# Patient Record
Sex: Female | Born: 1980 | Marital: Single | Smoking: Former smoker
Health system: Southern US, Community
[De-identification: ages and names within clinical notes are randomized; demographics above are authoritative.]

---

## 2016-07-28 ENCOUNTER — Encounter: Payer: Self-pay | Admitting: Obstetrics and Gynecology

## 2016-09-09 ENCOUNTER — Encounter: Payer: Self-pay | Admitting: Emergency Medicine

## 2016-09-09 ENCOUNTER — Emergency Department: Payer: Commercial Managed Care - PPO

## 2016-09-09 ENCOUNTER — Emergency Department
Admission: EM | Admit: 2016-09-09 | Discharge: 2016-09-09 | Disposition: A | Discharge status: Home / Self Care | Payer: Commercial Managed Care - PPO | Attending: Student in an Organized Health Care Education/Training Program | Admitting: Student in an Organized Health Care Education/Training Program

## 2016-09-09 DIAGNOSIS — M79662 Pain in left lower leg: Secondary | ICD-10-CM | POA: Diagnosis not present

## 2016-09-09 DIAGNOSIS — M79661 Pain in right lower leg: Secondary | ICD-10-CM | POA: Diagnosis present

## 2016-09-09 DIAGNOSIS — Z87891 Personal history of nicotine dependence: Secondary | ICD-10-CM | POA: Diagnosis not present

## 2016-09-09 MED ORDER — CYCLOBENZAPRINE HCL 10 MG PO TABS: 10.0000 mg | ORAL_TABLET | Freq: Three times a day (TID) | ORAL | 0 refills | Status: AC | PRN

## 2016-09-09 NOTE — ED Provider Notes (Signed)
[facility] Emergency Department Provider Note   ____________________________________________   First MD Initiated Contact with Patient 09/09/16 1519     (approximate)  I have reviewed the triage vital signs and the nursing notes.   HISTORY  Chief Complaint Leg Pain    HPI Sylvia Donaldson is a 36 y.o. female patient complain of bilateralcalf pain. Patient stated onset of pain after 3 weeks receiving iron effusions. Patient described a pain as "achy". Patient denies chest pain or shortness of breath. Patient state able to ambulate without difficulty. Patient rates the pain as a 6/10. No palliative measures for complaint. Patient discussed findings fusion tomorrow morning.  History reviewed. No pertinent past medical history.  There are no active problems to display for this patient.   History reviewed. No pertinent surgical history.  Prior to Admission medications   Medication Sig Start Date End Date Taking? Authorizing Provider  cyclobenzaprine (FLEXERIL) 10 MG tablet Take 1 tablet (10 mg total) by mouth 3 (three) times daily as needed. 09/09/16   Joni ReiningSmith, Nabiha Planck K, PA-C  cyclobenzaprine (FLEXERIL) 10 MG tablet Take 1 tablet (10 mg total) by mouth 3 (three) times daily as needed. 09/09/16   Joni ReiningSmith, Montez Stryker K, PA-C    Allergies Patient has no known allergies.  History reviewed. No pertinent family history.  Social History Social History  Substance Use Topics  . Smoking status: Former Games developermoker  . Smokeless tobacco: Never Used  . Alcohol use No    Review of Systems  Constitutional: No fever/chills Eyes: No visual changes. ENT: No sore throat. Cardiovascular: Denies chest pain. Respiratory: Denies shortness of breath. Gastrointestinal: No abdominal pain.  No nausea, no vomiting.  No diarrhea.  No constipation. Genitourinary: Negative for dysuria. Musculoskeletal: Bilateral leg pain. Skin: Negative for rash. Neurological: Negative for headaches,  focal weakness or numbness. 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 --      Excl. in GC? --     Constitutional: Alert and oriented. Well appearing and in no acute distress. Nose: No congestion/rhinnorhea. Mouth/Throat: Mucous membranes are moist.  Oropharynx non-erythematous. Neck: No stridor.  No cervical spine tenderness to palpation. Hematological/Lymphatic/Immunilogical: No cervical lymphadenopathy. Cardiovascular: Normal rate, regular rhythm. Grossly normal heart sounds.  Good peripheral circulation. Respiratory: Normal respiratory effort.  No retractions. Lungs CTAB. Musculoskeletal: no obvious edema or erythema bilateral calf. Negative Homans sign.  Neurologic:  Normal speech and language. No gross focal neurologic deficits are appreciated. No gait instability. Skin:  Skin is warm, dry and intact. No rash noted. Psychiatric: Mood and affect are normal. Speech and behavior are normal.  ____________________________________________   LABS (all labs ordered are listed, but only abnormal results are displayed)  Labs Reviewed - No data to display ____________________________________________  EKG   ____________________________________________  RADIOLOGY  No acute findings on ultrasound the bilateral lower extremity ____________________________________________   PROCEDURES  Procedure(s) performed: None  Procedures  Critical Care performed:  No  ____________________________________________   INITIAL IMPRESSION / ASSESSMENT AND PLAN / ED COURSE  Pertinent labs & imaging results that were available during my care of the patient were reviewed by me and considered in my medical decision making (see chart for details). [place] bilateral lower extremity.heart sounds negative for DVT. Patient given discharge care instructions. Patient given a school and work no. Patient advised follow-up with scheduled an infusion therapy tomorrow morning. Return to ER if condition worsens.     ____________________________________________   FINAL CLINICAL IMPRESSION(S) / ED DIAGNOSES  Final diagnoses:  Bilateral calf pain      NEW MEDICATIONS STARTED DURING THIS VISIT:  New Prescriptions   CYCLOBENZAPRINE (FLEXERIL) 10 MG TABLET    Take 1 tablet (10 mg total) by mouth 3 (three) times daily as needed.   CYCLOBENZAPRINE (FLEXERIL) 10 MG TABLET    Take 1 tablet (10 mg total) by mouth 3 (three) times daily as needed.     Note:  This document was prepared using Dragon voice recognition software and may include unintentional dictation errors.    Joni Reining, PA-C 09/09/16 1656    Willy Eddy, MD 09/09/16 [phone]

## 2016-09-09 NOTE — Discharge Instructions (Signed)
Follow-up for scheduled lab and iron infusions tomorrow.

## 2016-09-09 NOTE — ED Triage Notes (Signed)
Pt c/o aching to both calves, states aching started after she began receiving iron infusions. Pt ambulatory with no difficulty.

## 2018-08-27 IMAGING — US US EXTREM LOW VENOUS BILAT
1 series · 13 of 24 positions shown · non-contrast
Comparison: None.

CLINICAL DATA: Bilateral calf pain for 3 weeks. Recent iron
infusion for anemia.



[Series 1: us extrem low venous bilat · 0.07mm/px · 13 of 60 slices shown]
[im 1/60]
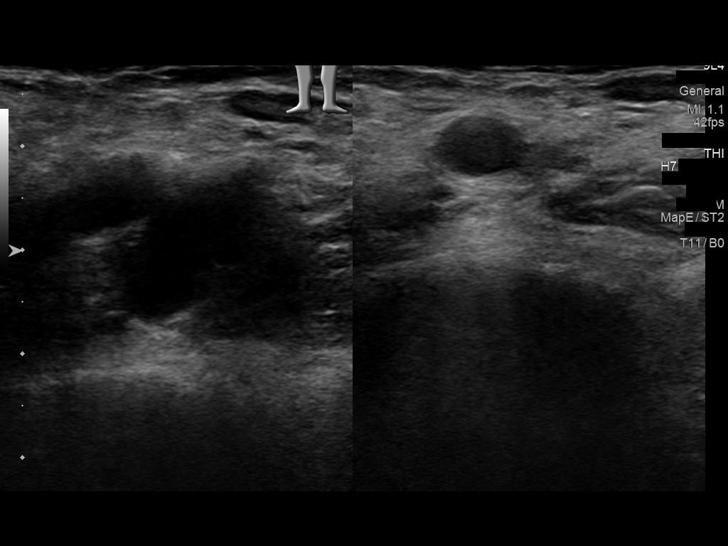
[im 6/60]
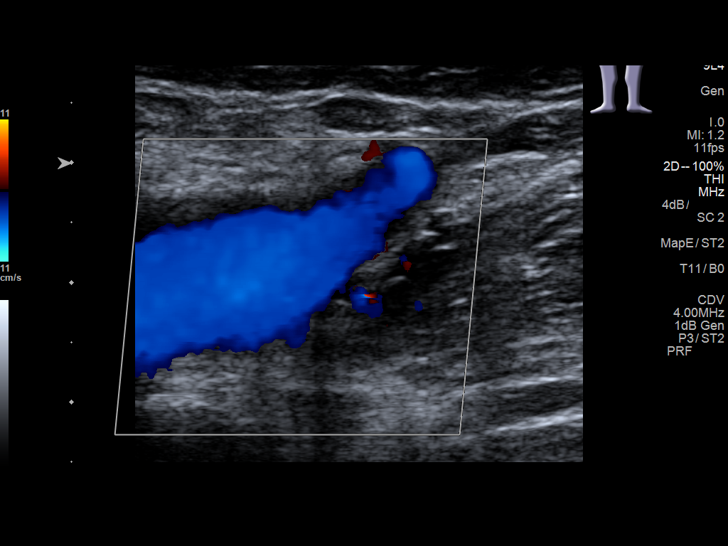
[im 11/60]
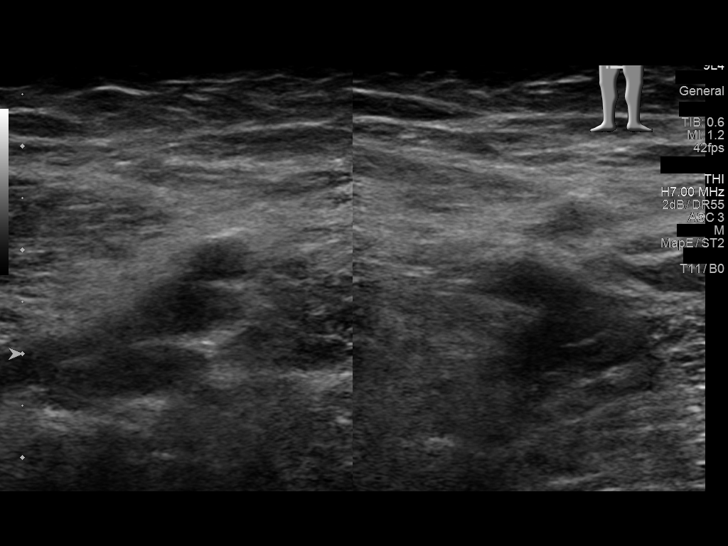
[im 16/60]
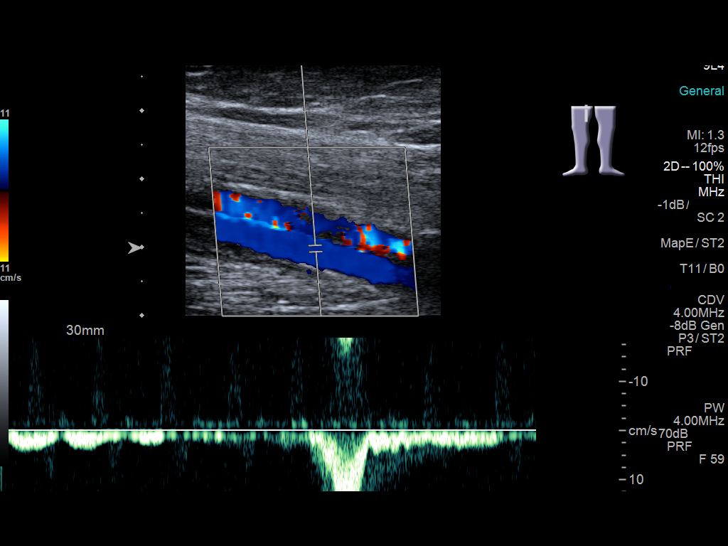
[im 21/60]
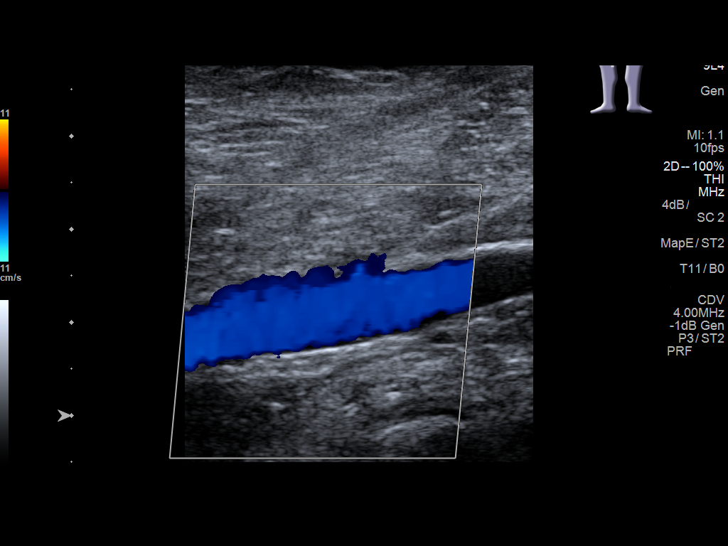
[im 26/60]
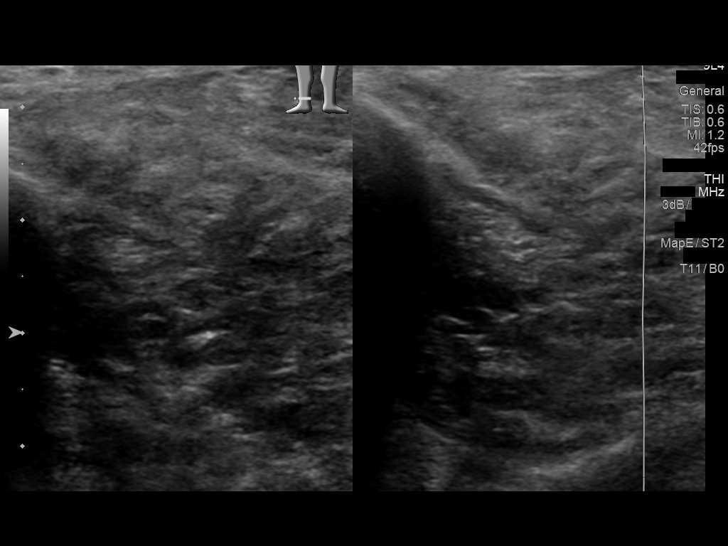
[im 31/60]
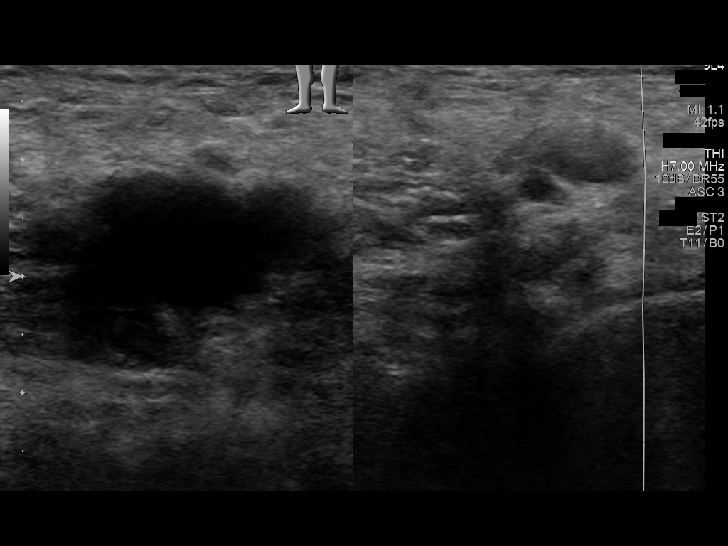
[im 34/60]
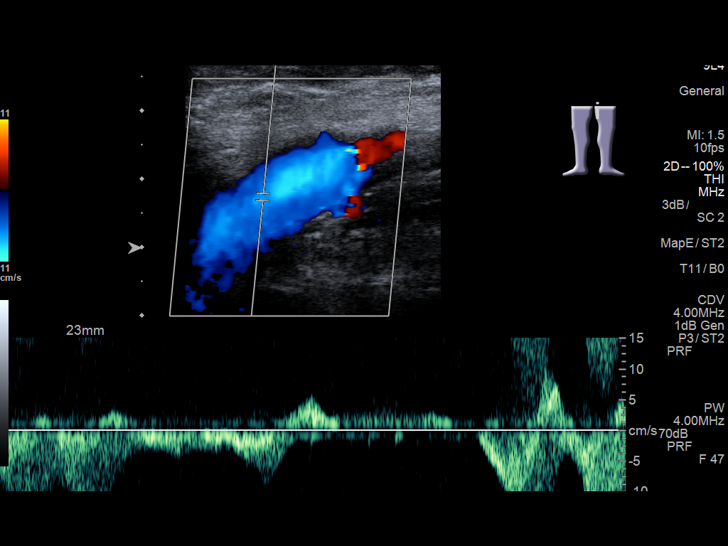
[im 39/60]
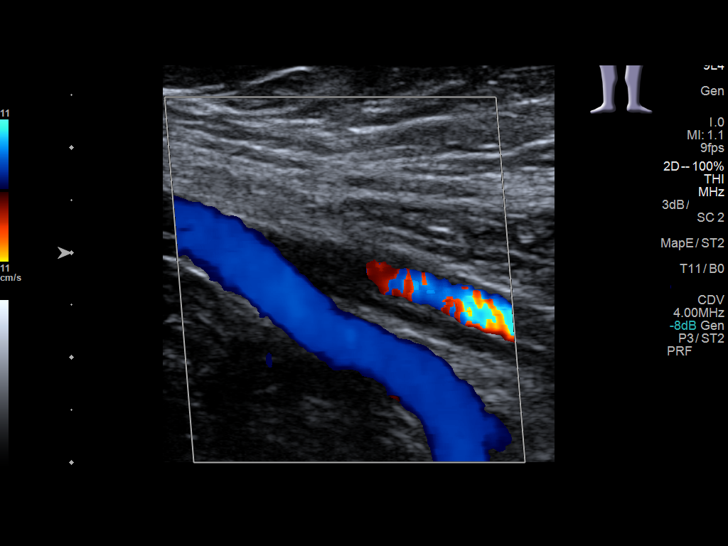
[im 44/60]
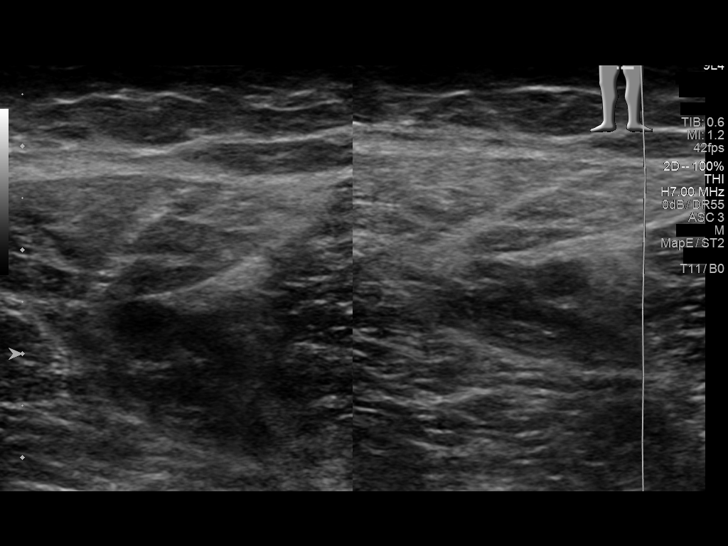
[im 49/60]
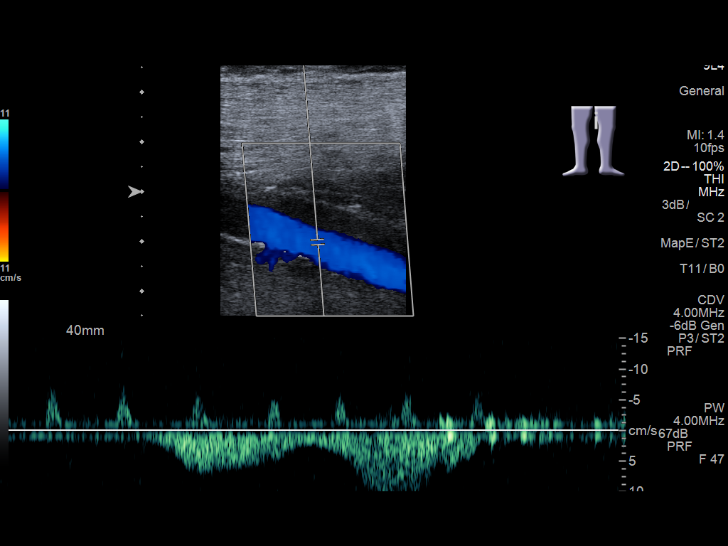
[im 54/60]
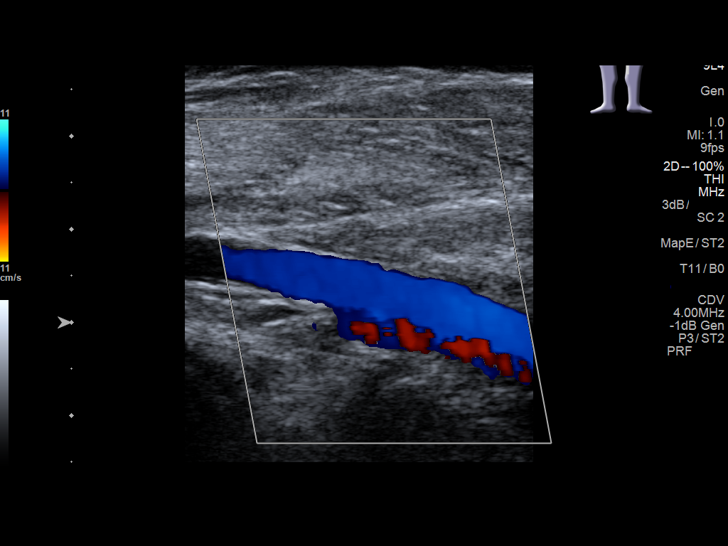
[im 60/60]
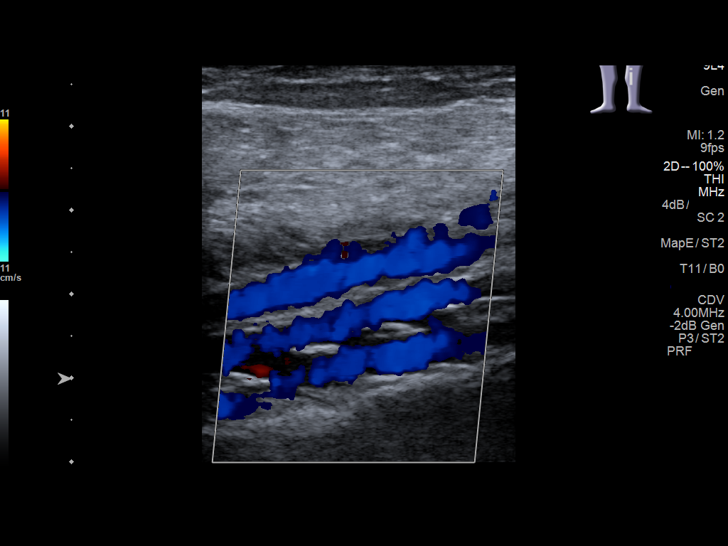

[13 of 24 positions shown; findings below may reference images not displayed]

FINDINGS: RIGHT LOWER EXTREMITY

Common Femoral Vein: No evidence of thrombus. Normal
compressibility, respiratory phasicity and response to augmentation.

Saphenofemoral Junction: No evidence of thrombus. Normal
compressibility and flow on color Doppler imaging.

Profunda Femoral Vein: No evidence of thrombus. Normal
compressibility and flow on color Doppler imaging.

Femoral Vein: No evidence of thrombus. Normal compressibility,
respiratory phasicity and response to augmentation.

Popliteal Vein: No evidence of thrombus. Normal compressibility,
respiratory phasicity and response to augmentation.

Calf Veins: No evidence of thrombus. Normal compressibility and flow
on color Doppler imaging.

Superficial Great Saphenous Vein: No evidence of thrombus. Normal
compressibility and flow on color Doppler imaging.

Venous Reflux:  None.

Other Findings:  None.

LEFT LOWER EXTREMITY

Common Femoral Vein: No evidence of thrombus. Normal
compressibility, respiratory phasicity and response to augmentation.

Saphenofemoral Junction: No evidence of thrombus. Normal
compressibility and flow on color Doppler imaging.

Profunda Femoral Vein: No evidence of thrombus. Normal
compressibility and flow on color Doppler imaging.

Femoral Vein: No evidence of thrombus. Normal compressibility,
respiratory phasicity and response to augmentation.

Popliteal Vein: No evidence of thrombus. Normal compressibility,
respiratory phasicity and response to augmentation.

Calf Veins: No evidence of thrombus. Normal compressibility and flow
on color Doppler imaging.

Superficial Great Saphenous Vein: No evidence of thrombus. Normal
compressibility and flow on color Doppler imaging.

Venous Reflux:  None.

Other Findings:  None.
IMPRESSION: No evidence of DVT within either lower extremity.
